# Patient Record
Sex: Female | Born: 1945 | Race: White | Hispanic: No | Marital: Married
Health system: Midwestern US, Community
[De-identification: ages and names within clinical notes are randomized; demographics above are authoritative.]

## PROBLEM LIST (undated history)

## (undated) DIAGNOSIS — J069 Acute upper respiratory infection, unspecified: Secondary | ICD-10-CM

## (undated) DIAGNOSIS — K5790 Diverticulosis of intestine, part unspecified, without perforation or abscess without bleeding: Secondary | ICD-10-CM

## (undated) DIAGNOSIS — E119 Type 2 diabetes mellitus without complications: Secondary | ICD-10-CM

## (undated) HISTORY — PX: HAND SURGERY: SHX662

---

## 1972-08-18 HISTORY — PX: ABDOMINAL HYSTERECTOMY: SHX81

## 2005-04-04 ENCOUNTER — Other Ambulatory Visit: Admission: RE | Admit: 2005-04-04 | Discharge: 2005-04-04 | Payer: Self-pay | Admitting: Family Medicine

## 2011-09-19 DIAGNOSIS — J069 Acute upper respiratory infection, unspecified: Secondary | ICD-10-CM

## 2011-09-19 HISTORY — DX: Acute upper respiratory infection, unspecified: J06.9

## 2011-12-08 ENCOUNTER — Encounter (HOSPITAL_COMMUNITY): Admission: RE | Admit: 2011-12-08 | Discharge: 2011-12-08 | Payer: Medicare Other | Source: Ambulatory Visit

## 2011-12-08 NOTE — Patient Instructions (Addendum)
Meghan Mcclure  12/08/2011   Your procedure is scheduled on:   12-18-2011  Report to [facility] at  0720  AM.  Call this number if you have problems the morning of surgery: [phone]   Remember:   Do not eat food:After Midnight.  May have clear liquids:until Midnight .  Clear liquids include soda, tea, black coffee, apple or grape juice, broth.  Take these medicines the morning of surgery with A SIP OF WATER:  none   Do not wear jewelry, make-up or nail polish.  Do not wear lotions, powders, or perfumes. You may wear deodorant.  Do not shave 48 hours prior to surgery.  Do not bring valuables to the hospital.  Contacts, dentures or bridgework may not be worn into surgery.  Leave suitcase in the car. After surgery it may be brought to your room.  For patients admitted to the hospital, checkout time is 11:00 AM the day of discharge.   Patients discharged the day of surgery will not be allowed to drive home.  Name and phone number of your driver: family  Special Instructions: N/A   Please read over the following fact sheets that you were given: Pain Booklet, Surgical Site Infection Prevention, Anesthesia Post-op Instructions and Care and Recovery After Surgery ataract A cataract is a clouding of the lens of the eye. When a lens becomes cloudy, vision is reduced based on the degree and nature of the clouding. Many cataracts reduce vision to some degree. Some cataracts make people more near-sighted as they develop. Other cataracts increase glare. Cataracts that are ignored and become worse can sometimes look white. The white color can be seen through the pupil. CAUSES   Aging. However, cataracts may occur at any age, even in newborns.   Certain drugs.   Trauma to the eye.   Certain diseases such as diabetes.   Specific eye diseases such as chronic inflammation inside the eye or a sudden attack of a rare form of glaucoma.   Inherited or acquired medical problems.  SYMPTOMS    Gradual, progressive drop in vision in the affected eye.   Severe, rapid visual loss. This most often happens when trauma is the cause.  DIAGNOSIS  To detect a cataract, an eye doctor examines the lens. Cataracts are best diagnosed with an exam of the eyes with the pupils enlarged (dilated) by drops.  TREATMENT  For an early cataract, vision may improve by using different eyeglasses or stronger lighting. If that does not help your vision, surgery is the only effective treatment. A cataract needs to be surgically removed when vision loss interferes with your everyday activities, such as driving, reading, or watching TV. A cataract may also have to be removed if it prevents examination or treatment of another eye problem. Surgery removes the cloudy lens and usually replaces it with a substitute lens (intraocular lens, IOL).  At a time when both you and your doctor agree, the cataract will be surgically removed. If you have cataracts in both eyes, only one is usually removed at a time. This allows the operated eye to heal and be out of danger from any possible problems after surgery (such as infection or poor wound healing). In rare cases, a cataract may be doing damage to your eye. In these cases, your caregiver may advise surgical removal right away. The vast majority of people who have cataract surgery have better vision afterward. HOME CARE INSTRUCTIONS  If you are not planning surgery, you may  be asked to do the following:  Use different eyeglasses.   Use stronger or brighter lighting.   Ask your eye doctor about reducing your medicine dose or changing medicines if it is thought that a medicine caused your cataract. Changing medicines does not make the cataract go away on its own.   Become familiar with your surroundings. Poor vision can lead to injury. Avoid bumping into things on the affected side. You are at a higher risk for tripping or falling.   Exercise extreme care when driving or  operating machinery.   Wear sunglasses if you are sensitive to bright light or experiencing problems with glare.  SEEK IMMEDIATE MEDICAL CARE IF:   You have a worsening or sudden vision loss.   You notice redness, swelling, or increasing pain in the eye.   You have a fever.  Document Released: 08/04/2005 Document Revised: 07/24/2011 Document Reviewed: 03/28/2011 [facility] Patient Information 2012 [place], [place].PATIENT INSTRUCTIONS POST-ANESTHESIA  IMMEDIATELY FOLLOWING SURGERY:  Do not drive or operate machinery for the first twenty four hours after surgery.  Do not make any important decisions for twenty four hours after surgery or while taking narcotic pain medications or sedatives.  If you develop intractable nausea and vomiting or a severe headache please notify your doctor immediately.  FOLLOW-UP:  Please make an appointment with your surgeon as instructed. You do not need to follow up with anesthesia unless specifically instructed to do so.  WOUND CARE INSTRUCTIONS (if applicable):  Keep a dry clean dressing on the anesthesia/puncture wound site if there is drainage.  Once the wound has quit draining you may leave it open to air.  Generally you should leave the bandage intact for twenty four hours unless there is drainage.  If the epidural site drains for more than 36-48 hours please call the anesthesia department.  QUESTIONS?:  Please feel free to call your physician or the hospital operator if you have any questions, and they will be happy to assist you.     [facility] Anesthesia Department [address] [place] [phone]

## 2011-12-10 ENCOUNTER — Encounter (HOSPITAL_COMMUNITY): Payer: Self-pay

## 2011-12-10 ENCOUNTER — Encounter (HOSPITAL_COMMUNITY)
Admission: RE | Admit: 2011-12-10 | Discharge: 2011-12-10 | Disposition: A | Discharge status: Home / Self Care | Payer: Medicare Other | Source: Ambulatory Visit | Attending: Ophthalmology | Admitting: Ophthalmology

## 2011-12-10 ENCOUNTER — Encounter (HOSPITAL_COMMUNITY): Payer: Self-pay | Admitting: Pharmacy Technician

## 2011-12-10 HISTORY — DX: Diverticulosis of intestine, part unspecified, without perforation or abscess without bleeding: K57.90

## 2011-12-10 HISTORY — DX: Acute upper respiratory infection, unspecified: J06.9

## 2011-12-10 HISTORY — DX: Type 2 diabetes mellitus without complications: E11.9

## 2011-12-10 LAB — BASIC METABOLIC PANEL
BUN: 20 mg/dL (ref 6–23)
CO2: 27 mEq/L (ref 19–32)
Calcium: 9.5 mg/dL (ref 8.4–10.5)
Chloride: 105 mEq/L (ref 96–112)
Creatinine, Ser: 0.8 mg/dL (ref 0.50–1.10)
Glucose, Bld: 153 mg/dL — ABNORMAL HIGH (ref 70–99)

## 2011-12-10 LAB — HEMOGLOBIN AND HEMATOCRIT, BLOOD: HCT: 42.7 % (ref 36.0–46.0)

## 2011-12-17 MED ORDER — LIDOCAINE HCL (PF) 1 % IJ SOLN
INTRAMUSCULAR | Status: AC
  Filled 2011-12-17: qty 2

## 2011-12-17 MED ORDER — NEOMYCIN-POLYMYXIN-DEXAMETH 3.5-10000-0.1 OP OINT
TOPICAL_OINTMENT | OPHTHALMIC | Status: AC
  Filled 2011-12-17: qty 3.5

## 2011-12-17 MED ORDER — CYCLOPENTOLATE-PHENYLEPHRINE 0.2-1 % OP SOLN
OPHTHALMIC | Status: AC
  Administered 2011-12-18: 1 [drp] via OPHTHALMIC
  Filled 2011-12-17: qty 2

## 2011-12-17 MED ORDER — LIDOCAINE HCL 3.5 % OP GEL
OPHTHALMIC | Status: AC
  Administered 2011-12-18: 1 via OPHTHALMIC
  Filled 2011-12-17: qty 5

## 2011-12-17 MED ORDER — PHENYLEPHRINE HCL 2.5 % OP SOLN
OPHTHALMIC | Status: AC
  Filled 2011-12-17: qty 2

## 2011-12-17 MED ORDER — TETRACAINE HCL 0.5 % OP SOLN
OPHTHALMIC | Status: AC
  Administered 2011-12-18: 1 [drp] via OPHTHALMIC
  Filled 2011-12-17: qty 2

## 2011-12-18 ENCOUNTER — Ambulatory Visit (HOSPITAL_COMMUNITY)
Admission: RE | Admit: 2011-12-18 | Discharge: 2011-12-18 | Disposition: A | Discharge status: Home / Self Care | Payer: Medicare Other | Source: Ambulatory Visit | Attending: Ophthalmology | Admitting: Ophthalmology

## 2011-12-18 ENCOUNTER — Encounter (HOSPITAL_COMMUNITY)
Admission: RE | Disposition: A | Discharge status: Home / Self Care | Payer: Self-pay | Source: Ambulatory Visit | Attending: Ophthalmology

## 2011-12-18 ENCOUNTER — Encounter (HOSPITAL_COMMUNITY): Payer: Self-pay | Admitting: Anesthesiology

## 2011-12-18 ENCOUNTER — Encounter (HOSPITAL_COMMUNITY): Payer: Self-pay | Admitting: *Deleted

## 2011-12-18 ENCOUNTER — Ambulatory Visit (HOSPITAL_COMMUNITY): Payer: Medicare Other | Admitting: Anesthesiology

## 2011-12-18 DIAGNOSIS — E119 Type 2 diabetes mellitus without complications: Secondary | ICD-10-CM | POA: Insufficient documentation

## 2011-12-18 DIAGNOSIS — Z01812 Encounter for preprocedural laboratory examination: Secondary | ICD-10-CM | POA: Insufficient documentation

## 2011-12-18 DIAGNOSIS — Z01810 Encounter for preprocedural cardiovascular examination: Secondary | ICD-10-CM | POA: Insufficient documentation

## 2011-12-18 DIAGNOSIS — H2589 Other age-related cataract: Secondary | ICD-10-CM | POA: Insufficient documentation

## 2011-12-18 HISTORY — PX: CATARACT EXTRACTION W/PHACO: SHX586

## 2011-12-18 SURGERY — PHACOEMULSIFICATION, CATARACT, WITH IOL INSERTION
Anesthesia: Monitor Anesthesia Care | Site: Eye | Laterality: Right | Wound class: Clean

## 2011-12-18 MED ORDER — TETRACAINE HCL 0.5 % OP SOLN
1.0000 [drp] | OPHTHALMIC | Status: AC
  Administered 2011-12-18 (×3): 1 [drp] via OPHTHALMIC

## 2011-12-18 MED ORDER — EPINEPHRINE HCL 1 MG/ML IJ SOLN
INTRAMUSCULAR | Status: AC
  Filled 2011-12-18: qty 1

## 2011-12-18 MED ORDER — EPINEPHRINE HCL 1 MG/ML IJ SOLN
INTRAOCULAR | Status: DC | PRN
  Administered 2011-12-18: 09:00:00

## 2011-12-18 MED ORDER — PROVISC 10 MG/ML IO SOLN
INTRAOCULAR | Status: DC | PRN
  Administered 2011-12-18: 8.5 mg via INTRAOCULAR

## 2011-12-18 MED ORDER — POVIDONE-IODINE 5 % OP SOLN
OPHTHALMIC | Status: DC | PRN
Start: 2011-12-18 — End: 2011-12-18
  Administered 2011-12-18: 1 via OPHTHALMIC

## 2011-12-18 MED ORDER — FENTANYL CITRATE 0.05 MG/ML IJ SOLN: 25.0000 ug | INTRAMUSCULAR | Status: DC | PRN

## 2011-12-18 MED ORDER — LACTATED RINGERS IV SOLN
INTRAVENOUS | Status: DC
  Administered 2011-12-18: 1000 mL via INTRAVENOUS

## 2011-12-18 MED ORDER — CYCLOPENTOLATE-PHENYLEPHRINE 0.2-1 % OP SOLN
1.0000 [drp] | OPHTHALMIC | Status: AC
  Administered 2011-12-18 (×3): 1 [drp] via OPHTHALMIC

## 2011-12-18 MED ORDER — MIDAZOLAM HCL 2 MG/2ML IJ SOLN
INTRAMUSCULAR | Status: AC
  Administered 2011-12-18: 2 mg via INTRAVENOUS
  Filled 2011-12-18: qty 2

## 2011-12-18 MED ORDER — NEOMYCIN-POLYMYXIN-DEXAMETH 0.1 % OP OINT
TOPICAL_OINTMENT | OPHTHALMIC | Status: DC | PRN
  Administered 2011-12-18: 1 via OPHTHALMIC

## 2011-12-18 MED ORDER — LIDOCAINE HCL (PF) 1 % IJ SOLN
INTRAMUSCULAR | Status: DC | PRN
  Administered 2011-12-18: .5 mL

## 2011-12-18 MED ORDER — LIDOCAINE 3.5 % OP GEL OPTIME - NO CHARGE
OPHTHALMIC | Status: DC | PRN
  Administered 2011-12-18: 1 [drp] via OPHTHALMIC

## 2011-12-18 MED ORDER — ONDANSETRON HCL 4 MG/2ML IJ SOLN: 4.0000 mg | Freq: Once | INTRAMUSCULAR | Status: DC | PRN

## 2011-12-18 MED ORDER — LIDOCAINE HCL 3.5 % OP GEL
1.0000 "application " | Freq: Once | OPHTHALMIC | Status: AC
  Administered 2011-12-18: 1 via OPHTHALMIC

## 2011-12-18 MED ORDER — MIDAZOLAM HCL 2 MG/2ML IJ SOLN
1.0000 mg | INTRAMUSCULAR | Status: DC | PRN
  Administered 2011-12-18: 2 mg via INTRAVENOUS

## 2011-12-18 MED ORDER — PHENYLEPHRINE HCL 2.5 % OP SOLN
1.0000 [drp] | OPHTHALMIC | Status: AC
  Administered 2011-12-18 (×3): 1 [drp] via OPHTHALMIC

## 2011-12-18 MED ORDER — BSS IO SOLN
INTRAOCULAR | Status: DC | PRN
  Administered 2011-12-18: 15 mL via INTRAOCULAR

## 2011-12-18 SURGICAL SUPPLY — 32 items
CAPSULAR TENSION RING-AMO (OPHTHALMIC RELATED) IMPLANT
CLOTH BEACON ORANGE TIMEOUT ST (SAFETY) ×2 IMPLANT
EYE SHIELD UNIVERSAL CLEAR (GAUZE/BANDAGES/DRESSINGS) ×4 IMPLANT
GLOVE BIO SURGEON STRL SZ 6.5 (GLOVE) IMPLANT
GLOVE BIOGEL PI IND STRL 6.5 (GLOVE) ×2 IMPLANT
GLOVE BIOGEL PI IND STRL 7.0 (GLOVE) IMPLANT
GLOVE BIOGEL PI IND STRL 7.5 (GLOVE) IMPLANT
GLOVE BIOGEL PI INDICATOR 6.5 (GLOVE) ×2
GLOVE BIOGEL PI INDICATOR 7.0 (GLOVE)
GLOVE BIOGEL PI INDICATOR 7.5 (GLOVE)
GLOVE ECLIPSE 6.5 STRL STRAW (GLOVE) IMPLANT
GLOVE ECLIPSE 7.0 STRL STRAW (GLOVE) IMPLANT
GLOVE ECLIPSE 7.5 STRL STRAW (GLOVE) IMPLANT
GLOVE EXAM NITRILE LRG STRL (GLOVE) IMPLANT
GLOVE EXAM NITRILE MD LF STRL (GLOVE) ×2 IMPLANT
GLOVE SKINSENSE NS SZ6.5 (GLOVE)
GLOVE SKINSENSE NS SZ7.0 (GLOVE)
GLOVE SKINSENSE STRL SZ6.5 (GLOVE) IMPLANT
GLOVE SKINSENSE STRL SZ7.0 (GLOVE) IMPLANT
KIT VITRECTOMY (OPHTHALMIC RELATED) IMPLANT
PAD ARMBOARD 7.5X6 YLW CONV (MISCELLANEOUS) ×2 IMPLANT
PROC W NO LENS (INTRAOCULAR LENS)
PROC W SPEC LENS (INTRAOCULAR LENS)
PROCESS W NO LENS (INTRAOCULAR LENS) IMPLANT
PROCESS W SPEC LENS (INTRAOCULAR LENS) IMPLANT
RING MALYGIN (MISCELLANEOUS) IMPLANT
SIGHTPATH CAT PROC W REG LENS (Ophthalmic Related) ×2 IMPLANT
SYR TB 1ML LL NO SAFETY (SYRINGE) ×2 IMPLANT
TAPE SURG TRANSPORE 1 IN (GAUZE/BANDAGES/DRESSINGS) ×1 IMPLANT
TAPE SURGICAL TRANSPORE 1 IN (GAUZE/BANDAGES/DRESSINGS) ×1
VISCOELASTIC ADDITIONAL (OPHTHALMIC RELATED) IMPLANT
WATER STERILE IRR 250ML POUR (IV SOLUTION) ×2 IMPLANT

## 2011-12-18 NOTE — Op Note (Signed)
NAME:  Meghan, Mcclure NO.:  [id number]  MEDICAL RECORD NO.:  [id number]  LOCATION:  [facility]                          FACILITY:  [facility]  PHYSICIAN:  Susanne Greenhouse, MD       DATE OF BIRTH:  1946-05-07  DATE OF PROCEDURE:  12/18/2011 DATE OF DISCHARGE:  12/18/2011                              OPERATIVE REPORT   PREOPERATIVE DIAGNOSIS:  Combined cataract, right eye, diagnosis code 366.19.  POSTOPERATIVE DIAGNOSIS:  Combined cataract, right eye, diagnosis code 366.19.  OPERATION PERFORMED:  Phacoemulsification with posterior chamber intraocular lens implantation, right eye.  SURGEON:  Bonne Dolores. Lajuana Patchell, MD  ANESTHESIA:  Topical with MAC.  OPERATIVE SUMMARY:  In the preoperative area, dilating drops were placed into the right eye.  The patient was then brought into the operating room where she was placed under general anesthesia.  The eye was then prepped and draped.  Beginning with a 75 blade, a paracentesis port was made at the surgeon's 2 o'clock position.  The anterior chamber was then filled with a 1% nonpreserved lidocaine solution with epinephrine.  This was followed by Viscoat to deepen the chamber.  A small fornix-based peritomy was performed superiorly.  Next, a single iris hook was placed through the limbus superiorly.  A 2.4-mm keratome blade was then used to make a clear corneal incision over the iris hook.  A bent cystotome needle and Utrata forceps were used to create a continuous tear capsulotomy.  Hydrodissection was performed using balanced salt solution on a fine cannula.  The lens nucleus was then removed using phacoemulsification in a quadrant cracking technique.  The cortical material was then removed with irrigation and aspiration.  The capsular bag and anterior chamber were refilled with Provisc.  The wound was widened to approximately 3 mm and a posterior chamber intraocular lens was placed into the capsular bag without difficulty using an  [employer] lens injecting system.  A single 10-0 nylon suture was then used to close the incision as well as stromal hydration.  The Provisc was removed from the anterior chamber and capsular bag with irrigation and aspiration.  At this point, the wounds were tested for leak, which were negative.  The anterior chamber remained deep and stable.  The patient tolerated the procedure well.  There were no operative complications, and she awoke from general anesthesia without problem.  No surgical specimens.  Prosthetic device used is a Lenstec posterior chamber lens, model Softec HD, power of 15.5, serial number is [id number].          ______________________________ Susanne Greenhouse, MD     KEH/MEDQ  D:  12/18/2011  T:  12/18/2011  Job:  [id number]

## 2011-12-18 NOTE — H&P (Signed)
I have reviewed the H&P, the patient was re-examined, and I have identified no interval changes in medical condition and plan of care since the history and physical of record  

## 2011-12-18 NOTE — Anesthesia Procedure Notes (Signed)
Procedure Name: MAC Date/Time: 12/18/2011 8:43 AM Performed by: Franco Nones Pre-anesthesia Checklist: Patient identified, Emergency Drugs available, Suction available, Timeout performed and Patient being monitored Patient Re-evaluated:Patient Re-evaluated prior to inductionOxygen Delivery Method: Nasal Cannula

## 2011-12-18 NOTE — Anesthesia Postprocedure Evaluation (Signed)
  Anesthesia Post-op Note  Patient: Meghan Mcclure  Procedure(s) Performed: Procedure(s) (LRB): CATARACT EXTRACTION PHACO AND INTRAOCULAR LENS PLACEMENT (IOC) (Right)  Patient Location:  Short Stay  Anesthesia Type: MAC  Level of Consciousness: awake  Airway and Oxygen Therapy: Patient Spontanous Breathing  Post-op Pain: none  Post-op Assessment: Post-op Vital signs reviewed, Patient's Cardiovascular Status Stable, Respiratory Function Stable, Patent Airway, No signs of Nausea or vomiting and Pain level controlled  Post-op Vital Signs: Reviewed and stable  Complications: No apparent anesthesia complications

## 2011-12-18 NOTE — Anesthesia Preprocedure Evaluation (Addendum)
Anesthesia Evaluation  Patient identified by MRN, date of birth, ID band Patient awake    Reviewed: Allergy & Precautions, H&P , NPO status , Patient's Chart, lab work & pertinent test results  Airway Mallampati: II      Dental  (+) Teeth Intact   Pulmonary Recent URI , Resolved,  breath sounds clear to auscultation        Cardiovascular negative cardio ROS  Rhythm:Regular     Neuro/Psych    GI/Hepatic   Endo/Other  Diabetes mellitus-, Well Controlled, Type 2, Oral Hypoglycemic Agents  Renal/GU      Musculoskeletal   Abdominal   Peds  Hematology   Anesthesia Other Findings   Reproductive/Obstetrics                           Anesthesia Physical Anesthesia Plan  ASA: II  Anesthesia Plan: MAC   Post-op Pain Management:    Induction: Intravenous  Airway Management Planned: Nasal Cannula  Additional Equipment:   Intra-op Plan:   Post-operative Plan:   Informed Consent: I have reviewed the patients History and Physical, chart, labs and discussed the procedure including the risks, benefits and alternatives for the proposed anesthesia with the patient or authorized representative who has indicated his/her understanding and acceptance.     Plan Discussed with:   Anesthesia Plan Comments:         Anesthesia Quick Evaluation

## 2011-12-18 NOTE — Transfer of Care (Signed)
Immediate Anesthesia Transfer of Care Note  Patient: Meghan Mcclure  Procedure(s) Performed: Procedure(s) (LRB): CATARACT EXTRACTION PHACO AND INTRAOCULAR LENS PLACEMENT (IOC) (Right)  Patient Location: Shortstay  Anesthesia Type: MAC  Level of Consciousness: awake  Airway & Oxygen Therapy: Patient Spontanous Breathing   Post-op Assessment: Report given to PACU RN, Post -op Vital signs reviewed and stable and Patient moving all extremities  Post vital signs: Reviewed and stable  Complications: No apparent anesthesia complications

## 2011-12-18 NOTE — Brief Op Note (Signed)
Pre-Op Dx: Cataract OD Post-Op Dx: Cataract OD Surgeon: Kearra Calkin Anesthesia: Topical with MAC Surgery: Cataract Extraction with Intraocular lens Implant OD Implant: Lenstec, Model Softec HD Blood Loss: None Specimen: None Complications: None 

## 2011-12-18 NOTE — Discharge Instructions (Signed)
Meghan Mcclure  12/18/2011     Instructions  1. Use medications as Instructed.  Shake well before use. Wait 5 minutes between drops.  {OPHTHALMIC ANTIBIOTICS:22167} 4 times a day x 1 week.  {OPHTHALMIC ANTI-INFLAMMATORY:22168} 2 times a day x 4 weeks.  {OPHTHALMIC STEROID:22169} 4 times a day - week 1   3 times a day - Week 2, 2 times a day- Week 3, 1 time a day - Week 4.  2. Do not rub the operative eye. Do not swim underwater for 2 weeks.  3. You may remove the clear shield and resume your normal activities the day after  Surgery. Your eyes may feel more comfortable if you wear dark glasses outside.  4. Call our office at [phone] if you have sudden change in vision, extreme redness or pain. Some fluctuation in vision is normal after surgery. If you have an emergency after hours, call Dr. Alto Denver at [phone].  5. It is important that you attend all of your follow-up appointments.        Follow-up:{follow up:32580} with Gemma Payor, MD.   Dr. Lahoma Crocker: [phone]  Dr. Lita Mains: [phone]  Dr. Alto Denver: [phone]   If you find that you cannot contact your physician, but feel that your signs and   Symptoms warrant a physician's attention, call the Emergency Room at   [phone] [phone].   Other{NA AND WUXLKGMW:10272}.

## 2011-12-22 ENCOUNTER — Encounter (HOSPITAL_COMMUNITY): Payer: Self-pay | Admitting: Ophthalmology

## 2011-12-25 ENCOUNTER — Encounter (HOSPITAL_COMMUNITY)
Admission: RE | Admit: 2011-12-25 | Discharge: 2011-12-25 | Payer: Medicare Other | Source: Ambulatory Visit | Admitting: Ophthalmology

## 2011-12-25 ENCOUNTER — Encounter (HOSPITAL_COMMUNITY): Payer: Self-pay | Admitting: *Deleted

## 2011-12-26 MED ORDER — CYCLOPENTOLATE-PHENYLEPHRINE 0.2-1 % OP SOLN
OPHTHALMIC | Status: AC
  Filled 2011-12-26: qty 2

## 2011-12-26 MED ORDER — PHENYLEPHRINE HCL 2.5 % OP SOLN
OPHTHALMIC | Status: AC
  Filled 2011-12-26: qty 2

## 2011-12-26 MED ORDER — NEOMYCIN-POLYMYXIN-DEXAMETH 3.5-10000-0.1 OP OINT
TOPICAL_OINTMENT | OPHTHALMIC | Status: AC
  Filled 2011-12-26: qty 3.5

## 2011-12-26 MED ORDER — LIDOCAINE HCL 3.5 % OP GEL
OPHTHALMIC | Status: AC
  Filled 2011-12-26: qty 5

## 2011-12-26 MED ORDER — TETRACAINE HCL 0.5 % OP SOLN
OPHTHALMIC | Status: AC
  Filled 2011-12-26: qty 2

## 2011-12-26 MED ORDER — LIDOCAINE HCL (PF) 1 % IJ SOLN
INTRAMUSCULAR | Status: AC
  Filled 2011-12-26: qty 2

## 2011-12-29 ENCOUNTER — Ambulatory Visit (HOSPITAL_COMMUNITY): Payer: Medicare Other | Admitting: Anesthesiology

## 2011-12-29 ENCOUNTER — Encounter (HOSPITAL_COMMUNITY): Payer: Self-pay | Admitting: Anesthesiology

## 2011-12-29 ENCOUNTER — Ambulatory Visit (HOSPITAL_COMMUNITY)
Admission: RE | Admit: 2011-12-29 | Discharge: 2011-12-29 | Disposition: A | Discharge status: Home / Self Care | Payer: Medicare Other | Source: Ambulatory Visit | Attending: Ophthalmology | Admitting: Ophthalmology

## 2011-12-29 ENCOUNTER — Encounter (HOSPITAL_COMMUNITY): Payer: Self-pay | Admitting: *Deleted

## 2011-12-29 ENCOUNTER — Encounter (HOSPITAL_COMMUNITY)
Admission: RE | Disposition: A | Discharge status: Home / Self Care | Payer: Self-pay | Source: Ambulatory Visit | Attending: Ophthalmology

## 2011-12-29 DIAGNOSIS — E119 Type 2 diabetes mellitus without complications: Secondary | ICD-10-CM | POA: Insufficient documentation

## 2011-12-29 DIAGNOSIS — H2589 Other age-related cataract: Secondary | ICD-10-CM | POA: Insufficient documentation

## 2011-12-29 HISTORY — PX: CATARACT EXTRACTION W/PHACO: SHX586

## 2011-12-29 SURGERY — PHACOEMULSIFICATION, CATARACT, WITH IOL INSERTION
Anesthesia: Monitor Anesthesia Care | Site: Eye | Laterality: Left | Wound class: Clean

## 2011-12-29 MED ORDER — PHENYLEPHRINE HCL 2.5 % OP SOLN
OPHTHALMIC | Status: AC
  Filled 2011-12-29: qty 2

## 2011-12-29 MED ORDER — PROVISC 10 MG/ML IO SOLN
INTRAOCULAR | Status: DC | PRN
  Administered 2011-12-29: 8.5 mg via INTRAOCULAR

## 2011-12-29 MED ORDER — LIDOCAINE HCL 3.5 % OP GEL: 1.0000 "application " | Freq: Once | OPHTHALMIC | Status: DC

## 2011-12-29 MED ORDER — EPINEPHRINE HCL 1 MG/ML IJ SOLN
INTRAMUSCULAR | Status: AC
  Filled 2011-12-29: qty 1

## 2011-12-29 MED ORDER — CYCLOPENTOLATE-PHENYLEPHRINE 0.2-1 % OP SOLN
1.0000 [drp] | OPHTHALMIC | Status: AC
  Administered 2011-12-29 (×3): 1 [drp] via OPHTHALMIC

## 2011-12-29 MED ORDER — LIDOCAINE 3.5 % OP GEL OPTIME - NO CHARGE
OPHTHALMIC | Status: DC | PRN
  Administered 2011-12-29: 2 [drp] via OPHTHALMIC

## 2011-12-29 MED ORDER — BSS IO SOLN
INTRAOCULAR | Status: DC | PRN
  Administered 2011-12-29: 15 mL via INTRAOCULAR

## 2011-12-29 MED ORDER — MIDAZOLAM HCL 2 MG/2ML IJ SOLN
1.0000 mg | INTRAMUSCULAR | Status: DC | PRN
Start: 2011-12-29 — End: 2011-12-29
  Administered 2011-12-29: 2 mg via INTRAVENOUS

## 2011-12-29 MED ORDER — NEOMYCIN-POLYMYXIN-DEXAMETH 0.1 % OP OINT
TOPICAL_OINTMENT | OPHTHALMIC | Status: DC | PRN
  Administered 2011-12-29: 1 via OPHTHALMIC

## 2011-12-29 MED ORDER — ONDANSETRON HCL 4 MG/2ML IJ SOLN: 4.0000 mg | Freq: Once | INTRAMUSCULAR | Status: DC | PRN

## 2011-12-29 MED ORDER — TETRACAINE HCL 0.5 % OP SOLN
OPHTHALMIC | Status: AC
  Filled 2011-12-29: qty 2

## 2011-12-29 MED ORDER — FENTANYL CITRATE 0.05 MG/ML IJ SOLN
25.0000 ug | INTRAMUSCULAR | Status: DC | PRN
Start: 2011-12-29 — End: 2011-12-29

## 2011-12-29 MED ORDER — FLURBIPROFEN SODIUM 0.03 % OP SOLN
OPHTHALMIC | Status: AC
  Filled 2011-12-29: qty 2.5

## 2011-12-29 MED ORDER — PHENYLEPHRINE HCL 2.5 % OP SOLN
1.0000 [drp] | OPHTHALMIC | Status: AC
  Administered 2011-12-29 (×3): 1 [drp] via OPHTHALMIC

## 2011-12-29 MED ORDER — CYCLOPENTOLATE-PHENYLEPHRINE 0.2-1 % OP SOLN
OPHTHALMIC | Status: AC
  Filled 2011-12-29: qty 2

## 2011-12-29 MED ORDER — POVIDONE-IODINE 5 % OP SOLN
OPHTHALMIC | Status: DC | PRN
  Administered 2011-12-29: 1 via OPHTHALMIC

## 2011-12-29 MED ORDER — MIDAZOLAM HCL 2 MG/2ML IJ SOLN
INTRAMUSCULAR | Status: AC
  Administered 2011-12-29: 2 mg via INTRAVENOUS
  Filled 2011-12-29: qty 2

## 2011-12-29 MED ORDER — LIDOCAINE HCL (PF) 1 % IJ SOLN
INTRAMUSCULAR | Status: DC | PRN
  Administered 2011-12-29: .4 mL

## 2011-12-29 MED ORDER — EPINEPHRINE HCL 1 MG/ML IJ SOLN
INTRAOCULAR | Status: DC | PRN
  Administered 2011-12-29: 11:00:00

## 2011-12-29 MED ORDER — LACTATED RINGERS IV SOLN
INTRAVENOUS | Status: DC
  Administered 2011-12-29: 11:00:00 via INTRAVENOUS

## 2011-12-29 MED ORDER — TETRACAINE HCL 0.5 % OP SOLN
1.0000 [drp] | OPHTHALMIC | Status: AC
  Administered 2011-12-29 (×3): 1 [drp] via OPHTHALMIC

## 2011-12-29 SURGICAL SUPPLY — 32 items
CAPSULAR TENSION RING-AMO (OPHTHALMIC RELATED) IMPLANT
CLOTH BEACON ORANGE TIMEOUT ST (SAFETY) ×2 IMPLANT
EYE SHIELD UNIVERSAL CLEAR (GAUZE/BANDAGES/DRESSINGS) ×2 IMPLANT
GLOVE BIO SURGEON STRL SZ 6.5 (GLOVE) IMPLANT
GLOVE BIOGEL PI IND STRL 6.5 (GLOVE) IMPLANT
GLOVE BIOGEL PI IND STRL 7.0 (GLOVE) ×1 IMPLANT
GLOVE BIOGEL PI IND STRL 7.5 (GLOVE) IMPLANT
GLOVE BIOGEL PI INDICATOR 6.5 (GLOVE)
GLOVE BIOGEL PI INDICATOR 7.0 (GLOVE) ×1
GLOVE BIOGEL PI INDICATOR 7.5 (GLOVE)
GLOVE ECLIPSE 6.5 STRL STRAW (GLOVE) IMPLANT
GLOVE ECLIPSE 7.0 STRL STRAW (GLOVE) IMPLANT
GLOVE ECLIPSE 7.5 STRL STRAW (GLOVE) IMPLANT
GLOVE EXAM NITRILE LRG STRL (GLOVE) IMPLANT
GLOVE EXAM NITRILE MD LF STRL (GLOVE) ×2 IMPLANT
GLOVE SKINSENSE NS SZ6.5 (GLOVE)
GLOVE SKINSENSE NS SZ7.0 (GLOVE)
GLOVE SKINSENSE STRL SZ6.5 (GLOVE) IMPLANT
GLOVE SKINSENSE STRL SZ7.0 (GLOVE) IMPLANT
KIT VITRECTOMY (OPHTHALMIC RELATED) IMPLANT
PAD ARMBOARD 7.5X6 YLW CONV (MISCELLANEOUS) ×2 IMPLANT
PROC W NO LENS (INTRAOCULAR LENS)
PROC W SPEC LENS (INTRAOCULAR LENS)
PROCESS W NO LENS (INTRAOCULAR LENS) IMPLANT
PROCESS W SPEC LENS (INTRAOCULAR LENS) IMPLANT
RING MALYGIN (MISCELLANEOUS) IMPLANT
SIGHTPATH CAT PROC W REG LENS (Ophthalmic Related) ×2 IMPLANT
SYR TB 1ML LL NO SAFETY (SYRINGE) ×2 IMPLANT
TAPE SURG TRANSPORE 1 IN (GAUZE/BANDAGES/DRESSINGS) ×1 IMPLANT
TAPE SURGICAL TRANSPORE 1 IN (GAUZE/BANDAGES/DRESSINGS) ×1
VISCOELASTIC ADDITIONAL (OPHTHALMIC RELATED) IMPLANT
WATER STERILE IRR 250ML POUR (IV SOLUTION) ×2 IMPLANT

## 2011-12-29 NOTE — Discharge Instructions (Signed)
Meghan Mcclure  12/29/2011     Instructions  1. Use medications as Instructed.  Shake well before use. Wait 5 minutes between drops.  {OPHTHALMIC ANTIBIOTICS:22167} 4 times a day x 1 week.  {OPHTHALMIC ANTI-INFLAMMATORY:22168} 2 times a day x 4 weeks.  {OPHTHALMIC STEROID:22169} 4 times a day - week 1   3 times a day - Week 2, 2 times a day- Week 3, 1 time a day - Week 4.  2. Do not rub the operative eye. Do not swim underwater for 2 weeks.  3. You may remove the clear shield and resume your normal activities the day after  Surgery. Your eyes may feel more comfortable if you wear dark glasses outside.  4. Call our office at [phone] if you have sudden change in vision, extreme redness or pain. Some fluctuation in vision is normal after surgery. If you have an emergency after hours, call Dr. Alto Denver at [phone].  5. It is important that you attend all of your follow-up appointments.        Follow-up:{follow up:32580} with Meghan Payor, MD.   Dr. Lahoma Crocker: [phone]  Dr. Lita Mains: [phone]  Dr. Alto Denver: [phone]   If you find that you cannot contact your physician, but feel that your signs and   Symptoms warrant a physician's attention, call the Emergency Room at   [phone] ext.[phone].   Other{NA AND PPIRJJOA:41660}.

## 2011-12-29 NOTE — Brief Op Note (Signed)
Pre-Op Dx: Cataract OS Post-Op Dx: Cataract OS Surgeon: Deshawn Witty Anesthesia: Topical with MAC Surgery: Cataract Extraction with Intraocular lens Implant OS Implant: Lenstec, Model Softec HD Specimen: None Complications: None 

## 2011-12-29 NOTE — H&P (Signed)
I have reviewed the H&P, the patient was re-examined, and I have identified no interval changes in medical condition and plan of care since the history and physical of record  

## 2011-12-29 NOTE — Anesthesia Preprocedure Evaluation (Signed)
Anesthesia Evaluation  Patient identified by MRN, date of birth, ID band Patient awake    Reviewed: Allergy & Precautions, H&P , NPO status , Patient's Chart, lab work & pertinent test results  Airway Mallampati: II      Dental  (+) Teeth Intact   Pulmonary Recent URI , Resolved,  breath sounds clear to auscultation        Cardiovascular negative cardio ROS  Rhythm:Regular     Neuro/Psych    GI/Hepatic   Endo/Other  Diabetes mellitus-, Well Controlled, Type 2, Oral Hypoglycemic Agents  Renal/GU      Musculoskeletal   Abdominal   Peds  Hematology   Anesthesia Other Findings   Reproductive/Obstetrics                           Anesthesia Physical Anesthesia Plan  ASA: III  Anesthesia Plan: MAC   Post-op Pain Management:    Induction: Intravenous  Airway Management Planned: Nasal Cannula  Additional Equipment:   Intra-op Plan:   Post-operative Plan:   Informed Consent: I have reviewed the patients History and Physical, chart, labs and discussed the procedure including the risks, benefits and alternatives for the proposed anesthesia with the patient or authorized representative who has indicated his/her understanding and acceptance.     Plan Discussed with:   Anesthesia Plan Comments:         Anesthesia Quick Evaluation

## 2011-12-29 NOTE — Anesthesia Postprocedure Evaluation (Signed)
  Anesthesia Post-op Note  Patient: Meghan Mcclure  Procedure(s) Performed: Procedure(s) (LRB): CATARACT EXTRACTION PHACO AND INTRAOCULAR LENS PLACEMENT (IOC) (Left)  Patient Location: PACU and Short Stay  Anesthesia Type: MAC  Level of Consciousness: awake, alert , oriented and patient cooperative  Airway and Oxygen Therapy: Patient Spontanous Breathing  Post-op Pain: none  Post-op Assessment: Post-op Vital signs reviewed, Patient's Cardiovascular Status Stable, Respiratory Function Stable, Patent Airway, No signs of Nausea or vomiting and Pain level controlled  Post-op Vital Signs: Reviewed and stable  Complications: No apparent anesthesia complications

## 2011-12-29 NOTE — Transfer of Care (Signed)
Immediate Anesthesia Transfer of Care Note  Patient: Meghan Mcclure  Procedure(s) Performed: Procedure(s) (LRB): CATARACT EXTRACTION PHACO AND INTRAOCULAR LENS PLACEMENT (IOC) (Left)  Patient Location: PACU and Short Stay  Anesthesia Type: MAC  Level of Consciousness: awake, alert , oriented and patient cooperative  Airway & Oxygen Therapy: Patient Spontanous Breathing  Post-op Assessment: Report given to PACU RN, Post -op Vital signs reviewed and stable and Patient moving all extremities  Post vital signs: Reviewed and stable  Complications: No apparent anesthesia complications

## 2011-12-29 NOTE — Op Note (Signed)
NAME:  Meghan Mcclure, Meghan Mcclure                ACCOUNT NO.:  [id number]  MEDICAL RECORD NO.:  [id number]  LOCATION:  [facility]                          FACILITY:  [facility]  PHYSICIAN:  Susanne Greenhouse, MD       DATE OF BIRTH:  May 04, 1946  DATE OF PROCEDURE:  12/29/2011 DATE OF DISCHARGE:  12/29/2011                              OPERATIVE REPORT   PREOPERATIVE DIAGNOSIS:  Combined cataract, left eye, diagnosis code 366.19.  POSTOPERATIVE DIAGNOSIS:  Combined cataract, left eye, diagnosis code 366.19.  SURGEON:  Bonne Dolores. Fraser Busche, MD  ANESTHESIA:  Topical with monitored anesthesia care.  DESCRIPTION OF THE OPERATION:  In the preoperative holding area, dilating drop and viscous lidocaine were placed into the left eye.  The patient was then brought to the operating room where she was prepped and draped.  Beginning with a #75 blade, a paracentesis port was made at the surgeon's 2 o'clock position.  The anterior chamber was filled with a 1% nonpreserved lidocaine solution.  Because of poor visualization of the red reflex, the anterior chamber was filled with VisionBlue and the VisionBlue was then rinsed from the anterior chamber with balanced salt solution.  The anterior chamber was then filled with Provisc.  A 2.4-mm keratome blade was then used to make a clear corneal incision at the temporal limbus.  A bent cystotome needle was used to create a continuous tear capsulotomy.  Hydrodissection was performed with balanced salt solution and a fine cannula.  The lens nucleus was then removed using phacoemulsification and a quadrant cracking technique. Residual cortex was removed with irrigation and aspiration.  A capsular bag and anterior chamber were refilled with Provisc and a posterior chamber intraocular lens was placed into the capsular bag without difficulty using its lens injecting system.  The Provisc was then removed from the capsular bag and anterior chamber with irrigation and aspiration.  Stromal  hydration of the main incision and paracentesis ports was performed with balanced salt solution and a fine cannula.  The wounds were tested for leak, which were negative.  The patient tolerated the procedure well.  There were no operative complications and she was returned to the recovery area in satisfactory condition.  No surgical specimens.  Prosthetic device used is a Lenstec posterior chamber lens, model Softec HD, power of 14.0, serial number is [id number].          ______________________________ Susanne Greenhouse, MD    KEH/MEDQ  D:  12/29/2011  T:  12/29/2011  Job:  [id number]

## 2011-12-31 ENCOUNTER — Encounter (HOSPITAL_COMMUNITY): Payer: Self-pay | Admitting: Ophthalmology

## 2014-09-27 DIAGNOSIS — R079 Chest pain, unspecified: Secondary | ICD-10-CM

## 2014-09-27 NOTE — ED Notes (Addendum)
Pt arrives to the ED via EMS with chest pain that started a half hour PTA. Pt states she took 81mg  aspirin and her chest pain was relieved. Pt states the "chest pain came on all of a sudden and it felt hard to breath." Pt denies history of cardiac disease. Pt awaiting physician assessment. Labs drawn and sent to laboratory. Pt on monitor x3. EKG performed and given to Dr Ermalinda MemosBradshaw

## 2014-09-27 NOTE — ED Notes (Signed)
Dr Sharlot GowdaGregg at bedside to evaluate patient

## 2014-09-27 NOTE — ED Notes (Signed)
Phlebotomy at bedside for lab draw

## 2014-09-27 NOTE — ED Provider Notes (Signed)
HPI Comments: Rachael Randall is a 69 y.o. female with significant PMHx of DM who presents via EMS to [facility] ED c/o sudden onset, gradually improving mid-sternal CP [phone] this PM. Pt reports her pain began in her chest that radiated to her R shoulder and then jaw. She states her pain has been occuring intermittently for a "couple years". Pt notes the pain is usually relieved by sitting up and trying to relax but did not provide relief for this episode. She reports only mild pain in her R shoulder at time of exam and her jaw and CP has resolved spontaneously. Pt has been using $RemoveBe'81mg'TeHkssbzW$  ASA, none today, and natural remedies which have both provide relief, but she has not been using the remedies recently. She states she saw her PCP 2 months ago and have routine blood work done. She reports FHx of heart disease in mother. Pt denies exacerbating factors and hx of high cholesterol or HTN.     PCP: Phys Other, MD    PMHx: Significant for DM  PSHx: Significant for unspecified GYN surgery  Social Hx: -smoker, +EtOH (socially)    There are no other changes, complaints, or physical findings at this time.  Written by Lawanna Kobus, ED Scribe, as dictated by Loni Beckwith, MD.    The history is provided by the patient.        Past Medical History:   Diagnosis Date   ??? Diabetes [facility])        Past Surgical History:   Procedure Laterality Date   ??? Hx gyn       partial   ??? Hx orthopaedic       right hand surgery         History reviewed. No pertinent family history.    History     Social History   ??? Marital Status: MARRIED     Spouse Name: N/A   ??? Number of Children: N/A   ??? Years of Education: N/A     Occupational History   ??? Not on file.     Social History Main Topics   ??? Smoking status: Never Smoker    ??? Smokeless tobacco: Not on file   ??? Alcohol Use: Yes      Comment: occasionally   ??? Drug Use: Not on file   ??? Sexual Activity: Not on file     Other Topics Concern   ??? Not on file     Social History Narrative   ??? No narrative on file            ALLERGIES: Ciprofloxacin      Review of Systems   Constitutional: Negative for fever, activity change, appetite change and fatigue.   HENT: Negative.  Negative for congestion, rhinorrhea and sore throat.    Respiratory: Negative.  Negative for cough, shortness of breath and wheezing.    Cardiovascular: Positive for chest pain. Negative for leg swelling.   Gastrointestinal: Negative.  Negative for nausea, vomiting, abdominal pain, diarrhea, constipation and abdominal distention.   Endocrine: Negative.    Genitourinary: Negative for dysuria, vaginal bleeding, vaginal discharge, difficulty urinating and menstrual problem.   Musculoskeletal: Negative.  Negative for myalgias, joint swelling and arthralgias.   Skin: Negative.  Negative for rash.   Neurological: Negative.  Negative for dizziness, weakness, light-headedness and headaches.   Psychiatric/Behavioral: Negative.        Patient Vitals for the past 12 hrs:   Temp Pulse Resp BP SpO2   09/27/14 2315 - Marland Kitchen)  59 11 185/75 mmHg 95 %   09/27/14 2300 - 62 12 187/81 mmHg 96 %   09/27/14 2245 - (!) 54 13 176/76 mmHg 97 %   09/27/14 2230 - (!) 57 10 171/70 mmHg 97 %   09/27/14 2215 - (!) 57 10 174/67 mmHg 95 %   09/27/14 2207 - (!) 56 10 163/85 mmHg 97 %   09/27/14 2145 - (!) 59 15 - 96 %   09/27/14 2130 - (!) 57 10 - 96 %   09/27/14 2115 - 65 14 - 96 %   09/27/14 2100 - 60 11 - 97 %   09/27/14 2045 98.7 ??F (37.1 ??C) 67 11 (!) 193/160 mmHg 95 %   09/27/14 2043 - 65 11 (!) 193/160 mmHg 94 %        Physical Exam   Constitutional: She is oriented to person, place, and time. She appears well-developed and well-nourished.   HENT:   Head: Atraumatic.   Eyes: EOM are normal.   Cardiovascular: Normal rate, regular rhythm, normal heart sounds and intact distal pulses.  Exam reveals no gallop and no friction rub.    No murmur heard.  Pulmonary/Chest: Effort normal and breath sounds normal. No respiratory distress. She has no wheezes. She has no rales. She exhibits no  tenderness.   Abdominal: Soft. Bowel sounds are normal. She exhibits no distension and no mass. There is no tenderness. There is no rebound and no guarding.   Musculoskeletal: Normal range of motion. She exhibits no edema or tenderness.   Neurological: She is oriented to person, place, and time.   Skin: Skin is warm.   Psychiatric: She has a normal mood and affect.   Nursing note and vitals reviewed.  Written by Lawanna Kobus, ED Scribe, as dictated by Loni Beckwith, MD.      MDM  Number of Diagnoses or Management Options  Atypical chest pain:   Chest pain radiating to jaw:   Diagnosis management comments: DDx: ACS, acute MI, unstable angina, atypical CP, low suspicion for PNA  Will obtain EKG, basic labs, and perform a two set cardiac enzyme cardiac work-up.  Reassess pt's pain and treat as needed. Pt has declined need for pain medication during H &P.       Amount and/or Complexity of Data Reviewed  Clinical lab tests: ordered and reviewed  Tests in the radiology section of CPT??: ordered and reviewed  Tests in the medicine section of CPT??: ordered and reviewed  Review and summarize past medical records: yes  Independent visualization of images, tracings, or specimens: yes    Patient Progress  Patient progress: stable      Procedures      EKG interpretation: (Preliminary)  2043  Rhythm: normal sinus rhythm; and regular . Rate (approx.): 63; Axis: normal; P wave: normal; QRS interval: normal ; ST/T wave: normal; Other findings: Left anterior fascicular block.   Written by Lawanna Kobus, ED Scribe, as dictated by Loni Beckwith, MD.    Progress Note:  10:28 PM  Pt declines pain medications at this time.  Written by Lawanna Kobus, ED Scribe, as dictated by Loni Beckwith, MD.     Progress Note:  12:13 AM  The patient has been re-evaluated. Both enzymes returned negative. She denies any jaw or CP. She had no recurrence of sxs during ED visit. Pt was given strict return precautions because she will be returning to [facility] Friday  and unable to have PCP F/U until Monday.  Written by  Lawanna Kobus, ED Scribe, as dictated by Loni Beckwith, MD.       LABORATORY TESTS:  Recent Results (from the past 12 hour(s))   EKG, 12 LEAD, INITIAL    Collection Time: 09/27/14  8:43 PM   Result Value Ref Range    Ventricular Rate 63 BPM    Atrial Rate 63 BPM    P-R Interval 150 ms    QRS Duration 86 ms    Q-T Interval 380 ms    QTC Calculation (Bezet) 388 ms    Calculated P Axis 10 degrees    Calculated R Axis -45 degrees    Calculated T Axis 59 degrees    Diagnosis       Normal sinus rhythm  Left anterior fascicular block  No previous ECGs available     METABOLIC PANEL, COMPREHENSIVE    Collection Time: 09/27/14  8:52 PM   Result Value Ref Range    Sodium 138 136 - 145 mmol/L    Potassium 4.2 3.5 - 5.1 mmol/L    Chloride 106 97 - 108 mmol/L    CO2 23 21 - 32 mmol/L    Anion gap 9 5 - 15 mmol/L    Glucose 243 (H) 65 - 100 mg/dL    BUN 21 (H) 6 - 20 MG/DL    Creatinine 1.06 (H) 0.55 - 1.02 MG/DL    BUN/Creatinine ratio 20 12 - 20      GFR est AA >60 >60 ml/min/1.34m    GFR est non-AA 52 (L) >60 ml/min/1.770m   Calcium 8.2 (L) 8.5 - 10.1 MG/DL    Bilirubin, total 0.3 0.2 - 1.0 MG/DL    ALT 32 12 - 78 U/L    AST 25 15 - 37 U/L    Alk. phosphatase 56 45 - 117 U/L    Protein, total 7.1 6.4 - 8.2 g/dL    Albumin 3.1 (L) 3.5 - 5.0 g/dL    Globulin 4.0 2.0 - 4.0 g/dL    A-G Ratio 0.8 (L) 1.1 - 2.2     CBC WITH AUTOMATED DIFF    Collection Time: 09/27/14  8:52 PM   Result Value Ref Range    WBC 9.6 3.6 - 11.0 K/uL    RBC 4.43 3.80 - 5.20 M/uL    HGB 14.0 11.5 - 16.0 g/dL    HCT 40.3 35.0 - 47.0 %    MCV 91.0 80.0 - 99.0 FL    MCH 31.6 26.0 - 34.0 PG    MCHC 34.7 30.0 - 36.5 g/dL    RDW 12.8 11.5 - 14.5 %    PLATELET 215 150 - 400 K/uL    NEUTROPHILS 59 32 - 75 %    LYMPHOCYTES 33 12 - 49 %    MONOCYTES 7 5 - 13 %    EOSINOPHILS 1 0 - 7 %    BASOPHILS 0 0 - 1 %    ABS. NEUTROPHILS 5.7 1.8 - 8.0 K/UL    ABS. LYMPHOCYTES 3.2 0.8 - 3.5 K/UL     ABS. MONOCYTES 0.7 0.0 - 1.0 K/UL    ABS. EOSINOPHILS 0.1 0.0 - 0.4 K/UL    ABS. BASOPHILS 0.0 0.0 - 0.1 K/UL   TROPONIN I    Collection Time: 09/27/14  8:52 PM   Result Value Ref Range    Troponin-I, Qt. <0.04 <0.05 ng/mL   CK W/ REFLX CKMB    Collection Time: 09/27/14  8:52 PM   Result Value Ref Range  CK 93 26 - 192 U/L   TROPONIN I    Collection Time: 09/27/14 11:03 PM   Result Value Ref Range    Troponin-I, Qt. <0.04 <0.05 ng/mL   CK W/ REFLX CKMB    Collection Time: 09/27/14 11:03 PM   Result Value Ref Range    CK 77 26 - 192 U/L         Imaging Results    ??    ?? XR CHEST PA LAT (Final result) Result time: 09/27/14 21:37:09   ?? Final result by Rad Results In Edi (09/27/14 21:37:09)   ?? Narrative:   ?? **Final Report**  ??    ICD Codes / Adm.Diagnosis: 100000 ?? / Chest Pain (Angina) ??  Examination: ??CR CHEST PA AND LATERAL ??- [id number] - Sep 27 2014 ??9:32PM  Accession No: ??[id number]  Reason: ??cp      REPORT:  Exam: ??2 view chest    Indication: Chest pain today    PA and lateral views demonstrate normal heart size. There is no acute   process in the lung fields. The osseous structures are unremarkable.  ????    IMPRESSION: No acute process.  ????    ??  ??  Signing/Reading Doctor: Zachery Conch [phone]) ??  Approved: Zachery Conch [phone]) ??Sep 27 2014 ??9:35PM ?? ?? ?? ?? ?? ?? ?? ?? ?? ??          IMPRESSION:  1. Atypical chest pain    2. Chest pain radiating to jaw        PLAN:  1. F/U with Dr Urbano Heir (cardiology) in 1 -2 days if you stay in [facility]  2. Or have your PCP schedule cardiology follow-up early next week if you stay here through Friday.  Return to ED if worse or symptoms recur    Discharge Note:  12:19 AM  The patient is ready for discharge. The patient???s signs, symptoms, diagnosis, and discharge instructions have been discussed and the patient has conveyed their understanding. The patient is to follow up as recommended or return to the ER should their symptoms worsen. Plan has  been discussed and the patient is in agreement.  Written by Lawanna Kobus, ED Scribe, as dictated by Loni Beckwith, MD.

## 2014-09-28 ENCOUNTER — Inpatient Hospital Stay
Admit: 2014-09-28 | Discharge: 2014-09-28 | Disposition: A | Discharge status: Home / Self Care | Payer: MEDICARE | Attending: Emergency Medicine

## 2014-09-28 LAB — CBC WITH AUTOMATED DIFF
ABS. BASOPHILS: 0 10*3/uL (ref 0.0–0.1)
ABS. EOSINOPHILS: 0.1 10*3/uL (ref 0.0–0.4)
ABS. LYMPHOCYTES: 3.2 10*3/uL (ref 0.8–3.5)
ABS. MONOCYTES: 0.7 10*3/uL (ref 0.0–1.0)
ABS. NEUTROPHILS: 5.7 10*3/uL (ref 1.8–8.0)
BASOPHILS: 0 % (ref 0–1)
EOSINOPHILS: 1 % (ref 0–7)
HCT: 40.3 % (ref 35.0–47.0)
HGB: 14 g/dL (ref 11.5–16.0)
LYMPHOCYTES: 33 % (ref 12–49)
MCH: 31.6 PG (ref 26.0–34.0)
MCHC: 34.7 g/dL (ref 30.0–36.5)
MCV: 91 FL (ref 80.0–99.0)
MONOCYTES: 7 % (ref 5–13)
NEUTROPHILS: 59 % (ref 32–75)
PLATELET: 215 10*3/uL (ref 150–400)
RBC: 4.43 M/uL (ref 3.80–5.20)
RDW: 12.8 % (ref 11.5–14.5)
WBC: 9.6 10*3/uL (ref 3.6–11.0)

## 2014-09-28 LAB — METABOLIC PANEL, COMPREHENSIVE
A-G Ratio: 0.8 — ABNORMAL LOW (ref 1.1–2.2)
ALT (SGPT): 32 U/L (ref 12–78)
AST (SGOT): 25 U/L (ref 15–37)
Albumin: 3.1 g/dL — ABNORMAL LOW (ref 3.5–5.0)
Alk. phosphatase: 56 U/L (ref 45–117)
Anion gap: 9 mmol/L (ref 5–15)
BUN/Creatinine ratio: 20 (ref 12–20)
BUN: 21 MG/DL — ABNORMAL HIGH (ref 6–20)
Bilirubin, total: 0.3 MG/DL (ref 0.2–1.0)
CO2: 23 mmol/L (ref 21–32)
Calcium: 8.2 MG/DL — ABNORMAL LOW (ref 8.5–10.1)
Chloride: 106 mmol/L (ref 97–108)
Creatinine: 1.06 MG/DL — ABNORMAL HIGH (ref 0.55–1.02)
GFR est AA: >60 mL/min/{1.73_m2} (ref >60)
GFR est non-AA: 52 mL/min/{1.73_m2} — ABNORMAL LOW (ref >60)
Globulin: 4 g/dL (ref 2.0–4.0)
Glucose: 243 mg/dL — ABNORMAL HIGH (ref 65–100)
Potassium: 4.2 mmol/L (ref 3.5–5.1)
Protein, total: 7.1 g/dL (ref 6.4–8.2)
Sodium: 138 mmol/L (ref 136–145)

## 2014-09-28 LAB — CK W/ REFLX CKMB
CK: 93 U/L (ref 26–192)
CK: 77 U/L (ref 26–192)

## 2014-09-28 LAB — TROPONIN I
Troponin-I, Qt.: <0.04 ng/mL (ref <0.05)
Troponin-I, Qt.: <0.04 ng/mL (ref <0.05)

## 2014-09-28 LAB — EKG, 12 LEAD, INITIAL
Atrial Rate: 63 {beats}/min
Calculated P Axis: 10 degrees
Calculated R Axis: -45 degrees
Calculated T Axis: 59 degrees
Diagnosis: NORMAL
P-R Interval: 150 ms
Q-T Interval: 380 ms
QRS Duration: 86 ms
QTC Calculation (Bezet): 388 ms
Ventricular Rate: 63 {beats}/min

## 2014-09-28 NOTE — ED Notes (Signed)
The doctor has reviewed discharge instructions with the patient. The patient verbalized understanding of the plan of care. Pt ambulatory out of ED with family member by side

## 2014-10-25 ENCOUNTER — Encounter: Payer: Self-pay | Admitting: Cardiology

## 2014-10-25 ENCOUNTER — Ambulatory Visit (INDEPENDENT_AMBULATORY_CARE_PROVIDER_SITE_OTHER): Payer: Medicare HMO | Admitting: Cardiology

## 2014-10-25 VITALS — BP 150/74 | HR 56 | Ht 62.0 in | Wt 255.2 lb

## 2014-10-25 DIAGNOSIS — R072 Precordial pain: Secondary | ICD-10-CM

## 2014-10-25 DIAGNOSIS — R0789 Other chest pain: Secondary | ICD-10-CM

## 2014-10-25 DIAGNOSIS — R079 Chest pain, unspecified: Secondary | ICD-10-CM | POA: Insufficient documentation

## 2014-10-25 MED ORDER — NITROGLYCERIN 0.4 MG SL SUBL: 0.4000 mg | SUBLINGUAL_TABLET | SUBLINGUAL | Status: DC | PRN

## 2014-10-25 NOTE — Patient Instructions (Signed)
Your physician recommends that you schedule a follow-up appointment in: 3 weeks with Joni ReiningKathryn Lawrence NP   START Nitroglycerine as needed for chest pain   Your physician has requested that you have en exercise stress myoview. For further information please visit [email or web address]. Please follow instruction sheet, as given.      Thank you for choosing  [facility] !

## 2014-10-25 NOTE — Progress Notes (Signed)
Clinical Summary [name] is a 69 y.o.female seen today as a new patient for the following medical problems.  1. Chest pain - admit 09/[facility] to [facility] [place]. From notes thought to be atypical, she was ruled out for ACS and discharged.   -episode occurred at rest. Pressure like pain midchest to right arm and jaw, 10/10. Mild palpitations. Not positional. Lasted x 20 minutes. Symptoms on and off x 2 years. This was most severe episode. Can often increase with stress. On average 2 times week. Can be better with leaning forward and taking deep breaths. No relation to food.   - notes some DOE, especially with walking inclines. Note some occasional LE edema, no orthopnea.    CAD risk factors: DM2 diet controlled,  mother with history of blockages unknown age.   2. Sinus brady - denies any lightheadness or dizziness.  - reviewing old EKGs appears to be chronic  Past Medical History  Diagnosis Date  . Type 2 diabetes, diet controlled   . Diverticulosis   . Recurrent upper respiratory infection (URI) 09/2011     Allergies  Allergen Reactions  . Ciprofloxacin Nausea And Vomiting and Hypertension     Current Outpatient Prescriptions  Medication Sig Dispense Refill  . aspirin 81 MG chewable tablet Chew 81 mg by mouth every morning.    . naproxen sodium (ANAPROX) 220 MG tablet Take 220 mg by mouth 2 (two) times daily with a meal.     No current facility-administered medications for this visit.     Past Surgical History  Procedure Laterality Date  . Abdominal hysterectomy  1974  . Hand surgery      left  . Cataract extraction w/phaco  12/18/2011    Procedure: CATARACT EXTRACTION PHACO AND INTRAOCULAR LENS PLACEMENT (IOC);  Surgeon: Gemma PayorKerry Hunt, MD;  Location: [facility];  Service: Ophthalmology;  Laterality: Right;  CDE:15.97  . Cataract extraction w/phaco  12/29/2011    Procedure: CATARACT EXTRACTION PHACO AND INTRAOCULAR LENS PLACEMENT  (IOC);  Surgeon: Gemma PayorKerry Hunt, MD;  Location: [facility];  Service: Ophthalmology;  Laterality: Left;  CDE 15.36     Allergies  Allergen Reactions  . Ciprofloxacin Nausea And Vomiting and Hypertension      Family History  Problem Relation Age of Onset  . Anesthesia problems Neg Hx   . Hypotension Neg Hx   . Malignant hyperthermia Neg Hx   . Pseudochol deficiency Neg Hx      Social History [name] reports that she has never smoked. She does not have any smokeless tobacco history on file. [name] reports that she drinks about 0.6 oz of alcohol per week.   Review of Systems CONSTITUTIONAL: No weight loss, fever, chills, weakness or fatigue.  HEENT: Eyes: No visual loss, blurred vision, double vision or yellow sclerae.No hearing loss, sneezing, congestion, runny nose or sore throat.  SKIN: No rash or itching.  CARDIOVASCULAR: per HPI RESPIRATORY: No shortness of breath, cough or sputum.  GASTROINTESTINAL: No anorexia, nausea, vomiting or diarrhea. No abdominal pain or blood.  GENITOURINARY: No burning on urination, no polyuria NEUROLOGICAL: No headache, dizziness, syncope, paralysis, ataxia, numbness or tingling in the extremities. No change in bowel or bladder control.  MUSCULOSKELETAL: No muscle, back pain, joint pain or stiffness.  LYMPHATICS: No enlarged nodes. No history of splenectomy.  PSYCHIATRIC: No history of depression or anxiety.  ENDOCRINOLOGIC: No reports of sweating, cold or heat intolerance. No polyuria or polydipsia.  .Marland Kitchen  Physical Examination p 56 bp 150/74 Wt 255 lbs BMI 47 Gen: resting comfortably, no acute distress HEENT: no scleral icterus, pupils equal round and reactive, no palptable cervical adenopathy,  CV: RRR, no m/r/g, no JVD, no carotid bruits Resp: Clear to auscultation bilaterally GI: abdomen is soft, non-tender, non-distended, normal bowel sounds, no hepatosplenomegaly MSK: extremities are warm, no edema.  Skin: warm, no rash Neuro:  no  focal deficits Psych: appropriate affect   Diagnostic Studies  Clinic EKG: sinus bradycardia, non-specific ST/T changes   Assessment and Plan  1. Chest pain - unclear etiology, patient is diabetic putting her at high risk for cardiac disease - will plan for exercise cardiolite to further evaluate for ischemia given her symptoms and risk factors.  - give Rx for prn NG  F/u 3 weeks.      Antoine Poche, M.D.

## 2014-10-27 ENCOUNTER — Ambulatory Visit (HOSPITAL_COMMUNITY)
Admission: RE | Admit: 2014-10-27 | Discharge: 2014-10-27 | Disposition: A | Discharge status: Home / Self Care | Payer: Medicare HMO | Source: Ambulatory Visit | Attending: Cardiology | Admitting: Cardiology

## 2014-10-27 ENCOUNTER — Encounter (HOSPITAL_COMMUNITY): Payer: Self-pay

## 2014-10-27 ENCOUNTER — Encounter (HOSPITAL_COMMUNITY)
Admission: RE | Admit: 2014-10-27 | Discharge: 2014-10-27 | Disposition: A | Discharge status: Home / Self Care | Payer: Medicare HMO | Source: Ambulatory Visit | Attending: Cardiology | Admitting: Cardiology

## 2014-10-27 DIAGNOSIS — E119 Type 2 diabetes mellitus without complications: Secondary | ICD-10-CM | POA: Insufficient documentation

## 2014-10-27 DIAGNOSIS — R079 Chest pain, unspecified: Secondary | ICD-10-CM | POA: Diagnosis not present

## 2014-10-27 DIAGNOSIS — R072 Precordial pain: Secondary | ICD-10-CM

## 2014-10-27 MED ORDER — SODIUM CHLORIDE 0.9 % IJ SOLN
10.0000 mL | INTRAMUSCULAR | Status: DC | PRN
  Administered 2014-10-27: 10 mL via INTRAVENOUS
  Filled 2014-10-27: qty 10

## 2014-10-27 MED ORDER — TECHNETIUM TC 99M SESTAMIBI - CARDIOLITE
30.0000 | Freq: Once | INTRAVENOUS | Status: AC | PRN
  Administered 2014-10-27: 10:00:00 30 via INTRAVENOUS

## 2014-10-27 MED ORDER — REGADENOSON 0.4 MG/5ML IV SOLN
INTRAVENOUS | Status: AC
  Administered 2014-10-27: 0.4 mg via INTRAVENOUS
  Filled 2014-10-27: qty 5

## 2014-10-27 MED ORDER — SODIUM CHLORIDE 0.9 % IJ SOLN
INTRAMUSCULAR | Status: AC
  Administered 2014-10-27: 10 mL via INTRAVENOUS
  Filled 2014-10-27: qty 3

## 2014-10-27 MED ORDER — REGADENOSON 0.4 MG/5ML IV SOLN
0.4000 mg | Freq: Once | INTRAVENOUS | Status: AC | PRN
  Administered 2014-10-27: 0.4 mg via INTRAVENOUS

## 2014-10-27 MED ORDER — TECHNETIUM TC 99M SESTAMIBI GENERIC - CARDIOLITE
10.0000 | Freq: Once | INTRAVENOUS | Status: AC | PRN
  Administered 2014-10-27: 10 via INTRAVENOUS

## 2014-10-27 NOTE — Progress Notes (Signed)
Stress Lab Nurses Notes - [facility]  Gareth EagleSusan G Colberg 10/27/2014 Reason for doing test: Chest Pain Type of test: Test Changed unable to reach THR on treadmill, Lexiscan given. Nurse performing test: Parke PoissonPhyllis Billingsly, RN Nuclear Medicine Tech: Marcella DubsMiranda Womack Echo Tech: Not Applicable MD performing test: S. McDowell/K.Lyman BishopLawrence NP Family MD: [facility] Test explained and consent signed: Yes.   IV started: Saline lock flushed, No redness or edema and Saline lock started in radiology Symptoms: fatigue & nausea Treatment/Intervention: None Reason test stopped: Fatigue in arms & legs.  Had nausea with Lexiscan. After recovery IV was: Discontinued via X-ray tech and No redness or edema Patient to return to Nuc. Med at : 10:30 Patient discharged: Home Patient's Condition upon discharge was: stable Comments: During test peak BP 154/68 & HR 129.  Recovery BP 115/61 & HR 75.  Symptoms resolved in recovery.  Erskine SpeedBillingsley, Ceasia Elwell T

## 2014-10-31 ENCOUNTER — Encounter: Payer: Self-pay | Admitting: Cardiology

## 2014-11-16 ENCOUNTER — Encounter: Payer: Medicare HMO | Admitting: Adult Health

## 2014-11-16 NOTE — Progress Notes (Signed)
ERROR. Cancelled appt 

## 2015-11-05 ENCOUNTER — Other Ambulatory Visit: Payer: Self-pay | Admitting: Cardiology

## 2016-03-24 ENCOUNTER — Emergency Department (HOSPITAL_COMMUNITY)
Admission: EM | Admit: 2016-03-24 | Discharge: 2016-03-24 | Disposition: A | Discharge status: Home / Self Care | Payer: No Typology Code available for payment source | Attending: Emergency Medicine | Admitting: Emergency Medicine

## 2016-03-24 ENCOUNTER — Emergency Department (HOSPITAL_COMMUNITY): Payer: No Typology Code available for payment source

## 2016-03-24 ENCOUNTER — Encounter (HOSPITAL_COMMUNITY): Payer: Self-pay | Admitting: Emergency Medicine

## 2016-03-24 DIAGNOSIS — E119 Type 2 diabetes mellitus without complications: Secondary | ICD-10-CM | POA: Diagnosis not present

## 2016-03-24 DIAGNOSIS — Y92410 Unspecified street and highway as the place of occurrence of the external cause: Secondary | ICD-10-CM | POA: Diagnosis not present

## 2016-03-24 DIAGNOSIS — H6092 Unspecified otitis externa, left ear: Secondary | ICD-10-CM | POA: Diagnosis not present

## 2016-03-24 DIAGNOSIS — M549 Dorsalgia, unspecified: Secondary | ICD-10-CM | POA: Insufficient documentation

## 2016-03-24 DIAGNOSIS — S199XXA Unspecified injury of neck, initial encounter: Secondary | ICD-10-CM | POA: Diagnosis present

## 2016-03-24 DIAGNOSIS — S161XXA Strain of muscle, fascia and tendon at neck level, initial encounter: Secondary | ICD-10-CM | POA: Insufficient documentation

## 2016-03-24 DIAGNOSIS — Y999 Unspecified external cause status: Secondary | ICD-10-CM | POA: Insufficient documentation

## 2016-03-24 DIAGNOSIS — Y939 Activity, unspecified: Secondary | ICD-10-CM | POA: Insufficient documentation

## 2016-03-24 MED ORDER — HYDROCODONE-ACETAMINOPHEN 5-325 MG PO TABS
1.0000 | ORAL_TABLET | Freq: Once | ORAL | Status: AC
  Administered 2016-03-24: 1 via ORAL
  Filled 2016-03-24: qty 1

## 2016-03-24 MED ORDER — AZITHROMYCIN 250 MG PO TABS: 250.0000 mg | ORAL_TABLET | Freq: Every day | ORAL | 0 refills | Status: AC

## 2016-03-24 MED ORDER — HYDROCODONE-ACETAMINOPHEN 5-325 MG PO TABS: 1.0000 | ORAL_TABLET | Freq: Four times a day (QID) | ORAL | 0 refills | Status: AC | PRN

## 2016-03-24 MED ORDER — NAPROXEN 500 MG PO TABS: 500.0000 mg | ORAL_TABLET | Freq: Two times a day (BID) | ORAL | 0 refills | Status: AC

## 2016-03-24 NOTE — Discharge Instructions (Signed)
Continue take the Cipro eardrops. Start taking the Zithromax. Take Naprosyn on a regular basis for the next week. Take the hydrocodone as needed for breakthrough pain. Keep your appointment to follow-up with her primary care doctor as scheduled for tomorrow.
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
## 2016-04-16 ENCOUNTER — Ambulatory Visit: Payer: Self-pay | Admitting: Pediatrics

## 2016-07-04 IMAGING — NM NM MYOCAR MULTI W/SPECT W/WALL MOTION & EF
2 series · 12 of 12 positions shown · non-contrast
Comparison: none

CLINICAL DATA: 68-year-old woman with a history of diabetes
mellitus and chest pain. This study is requested to evaluate for the
presence of ischemia.

EXAM:
MYOCARDIAL IMAGING WITH SPECT (REST AND PHARMACOLOGIC-STRESS)
GATED LEFT VENTRICULAR WALL MOTION STUDY
LEFT VENTRICULAR EJECTION FRACTION
TECHNIQUE: Standard myocardial SPECT imaging was performed after resting
intravenous injection of 10 mCi Rc-PPm sestamibi. Subsequently,
intravenous infusion of Lexiscan was performed under the supervision
of the Cardiology staff. At peak effect of the drug, 30 mCi Rc-PPm
sestamibi was injected intravenously and standard myocardial SPECT
imaging was performed. Quantitative gated imaging was also performed
to evaluate left ventricular wall motion, and estimate left
ventricular ejection fraction.

[Series 1: rest · 8.28mm/px · 6 of 64 frames shown]
[frame 6/64]
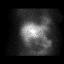
[frame 16/64]
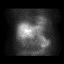
[frame 27/64]
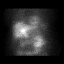
[frame 38/64]
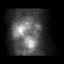
[frame 48/64]
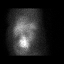
[frame 59/64]
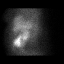

[Series 2: stress gated · 8.28mm/px · 6 of 64 frames shown]
[frame 6/64]
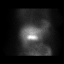
[frame 16/64]
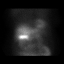
[frame 27/64]
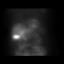
[frame 38/64]
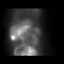
[frame 48/64]
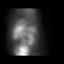
[frame 59/64]
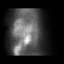

[12 of 12 positions shown; findings below may reference images not displayed]

FINDINGS: Baseline tracing shows sinus bradycardia at 45 beats per min with
low voltage and decreased R wave progression. She was exercised on a
Bruce protocol for 4 min and 36 seconds achieving a maximum workload
of 7 METS. Heart rate increased from 44 beats per min up to 129
beats per min which was 84% of the maximal age predicted heart rate.
Lexiscan was also given to ensure adequate stress response. Blood
pressure increased from 131/67 up to 154/68. No chest pain was
reported. She did experience fatigue and shortness of breath with
exercise. There were no diagnostic ST segment abnormalities, and no
arrhythmias were noted. Low risk Duke treadmill score 4.5.

Analysis of the raw perfusion data finds breast attenuation.

Perfusion: There is a small, mild to moderate intensity, apical
anterior/anteroseptal defect that exhibits partial reversibility.
Summed stress score is 4. This represents either variable soft
tissue attenuation versus small region of ischemia.

Wall Motion: Normal left ventricular wall motion. No left
ventricular dilation.

Left Ventricular Ejection Fraction: 71 %

End diastolic volume 80 wall on ml

End systolic volume 24 ml
IMPRESSION: 1. Apical anterior/anteroseptal defect representing either variable
breast attenuation or a small region of ischemia. No diagnostic ST
segment abnormalities on GXT at 84% MPHR (Lexiscan also given), low
risk Duke treadmill score 4.5.

2. Normal left ventricular wall motion.

3. Left ventricular ejection fraction 71%

4. Low-risk stress test findings*.

*1811 Appropriate Use Criteria for Coronary Revascularization
Focused Update: J Am Coll Cardiol. 1811;59(9):857-881.
[URL]

## 2021-07-28 ENCOUNTER — Other Ambulatory Visit: Payer: Self-pay

## 2021-07-28 ENCOUNTER — Encounter (HOSPITAL_COMMUNITY): Payer: Self-pay | Admitting: Emergency Medicine

## 2021-07-28 ENCOUNTER — Emergency Department (HOSPITAL_COMMUNITY)
Admission: EM | Admit: 2021-07-28 | Discharge: 2021-07-28 | Disposition: A | Discharge status: Home / Self Care | Payer: Medicare Other | Attending: Emergency Medicine | Admitting: Emergency Medicine

## 2021-07-28 ENCOUNTER — Emergency Department (HOSPITAL_COMMUNITY): Payer: Medicare Other

## 2021-07-28 DIAGNOSIS — Z7982 Long term (current) use of aspirin: Secondary | ICD-10-CM | POA: Diagnosis not present

## 2021-07-28 DIAGNOSIS — Z79899 Other long term (current) drug therapy: Secondary | ICD-10-CM | POA: Diagnosis not present

## 2021-07-28 DIAGNOSIS — E119 Type 2 diabetes mellitus without complications: Secondary | ICD-10-CM | POA: Insufficient documentation

## 2021-07-28 DIAGNOSIS — W11XXXA Fall on and from ladder, initial encounter: Secondary | ICD-10-CM | POA: Insufficient documentation

## 2021-07-28 DIAGNOSIS — S0990XA Unspecified injury of head, initial encounter: Secondary | ICD-10-CM | POA: Diagnosis present

## 2021-07-28 DIAGNOSIS — Z23 Encounter for immunization: Secondary | ICD-10-CM | POA: Diagnosis not present

## 2021-07-28 DIAGNOSIS — M545 Low back pain, unspecified: Secondary | ICD-10-CM | POA: Insufficient documentation

## 2021-07-28 DIAGNOSIS — M25512 Pain in left shoulder: Secondary | ICD-10-CM | POA: Insufficient documentation

## 2021-07-28 DIAGNOSIS — S0101XA Laceration without foreign body of scalp, initial encounter: Secondary | ICD-10-CM | POA: Diagnosis not present

## 2021-07-28 DIAGNOSIS — W19XXXA Unspecified fall, initial encounter: Secondary | ICD-10-CM | POA: Diagnosis not present

## 2021-07-28 DIAGNOSIS — Y92000 Kitchen of unspecified non-institutional (private) residence as  the place of occurrence of the external cause: Secondary | ICD-10-CM | POA: Insufficient documentation

## 2021-07-28 MED ORDER — LIDOCAINE-EPINEPHRINE (PF) 2 %-1:200000 IJ SOLN
10.0000 mL | Freq: Once | INTRAMUSCULAR | Status: AC
  Administered 2021-07-28: 10 mL
  Filled 2021-07-28: qty 20

## 2021-07-28 MED ORDER — TETANUS-DIPHTH-ACELL PERTUSSIS 5-2.5-18.5 LF-MCG/0.5 IM SUSY
0.5000 mL | PREFILLED_SYRINGE | Freq: Once | INTRAMUSCULAR | Status: AC
  Administered 2021-07-28: 0.5 mL via INTRAMUSCULAR
  Filled 2021-07-28: qty 0.5

## 2021-07-28 NOTE — Discharge Instructions (Addendum)
You were seen in the emergency department for evaluation of a head injury after a fall.  You had a CAT scan that did not show any bleeding internally in your head.  Your wound was cleaned and stapled and these will need to be removed in 7 to 10 days.  You can apply ice to the area and use Tylenol and ibuprofen as needed for pain.  You may shower.  Return to the emergency department if any signs of infection or other concerns.

## 2021-07-28 NOTE — ED Notes (Signed)
Wound irrigated, stapler at bedside.

## 2021-07-28 NOTE — ED Notes (Signed)
Pt A&OX4 ambulatory at d/c with independent steady gait. Offered wheelchair but she declined. Pt verbalized understanding of d/c instructions, wound care and follow up care.
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
## 2021-07-28 NOTE — ED Notes (Signed)
Pt transported to CT ?

## 2021-07-28 NOTE — ED Provider Notes (Signed)
[facility] EMERGENCY DEPARTMENT Provider Note   CSN: [id number] Arrival date & time: 07/28/21  1625     History Chief Complaint  Patient presents with   Meghan Mcclure    Meghan Mcclure is a 75 y.o. female.  Has a history of diabetes.  Complaining of a scalp laceration after she fell in the kitchen while she was on a 3 step ladder.  She said the ladder slipped out and she hit her head on the dishwasher when she fell.  No loss of consciousness.  Complaining of laceration to the back of her head and a mild headache.  She took 2 Advil prior to arrival.  She is a little bit of soreness in her low back and her left shoulder which are chronic areas but a little bit worse since the fall.  She does not feel like anything is broken.  No numbness or weakness chest pain or shortness of breath.  She has been ambulatory without any difficulty.  The history is provided by the patient.  Head Injury Location:  L parietal Mechanism of injury: fall   Fall:    Fall occurred:  From a ladder   Height of fall:  2   Impact surface:  Meghan Mcclure of impact:  Head   Entrapped after fall: no   Pain details:    Quality:  Dull   Severity:  Mild   Timing:  Constant   Progression:  Improving Chronicity:  New Relieved by:  NSAIDs Worsened by:  Nothing Ineffective treatments:  None tried Associated symptoms: no blurred vision, no difficulty breathing, no double vision, no focal weakness, no loss of consciousness, no nausea, no neck pain, no numbness and no vomiting       Past Medical History:  Diagnosis Date   Diverticulosis    Recurrent upper respiratory infection (URI) 09/2011   Type 2 diabetes, diet controlled [facility])     Patient Active Problem List   Diagnosis Date Noted   Chest pain 10/25/2014    Past Surgical History:  Procedure Laterality Date   ABDOMINAL HYSTERECTOMY  1974   CATARACT EXTRACTION W/PHACO  12/18/2011   Procedure: CATARACT EXTRACTION PHACO AND INTRAOCULAR LENS PLACEMENT (IOC);   Surgeon: Gemma Payor, MD;  Location: [facility];  Service: Ophthalmology;  Laterality: Right;  CDE:15.97   CATARACT EXTRACTION W/PHACO  12/29/2011   Procedure: CATARACT EXTRACTION PHACO AND INTRAOCULAR LENS PLACEMENT (IOC);  Surgeon: Gemma Payor, MD;  Location: [facility];  Service: Ophthalmology;  Laterality: Left;  CDE 15.36   HAND SURGERY     left     OB History   No obstetric history on file.     Family History  Problem Relation Age of Onset   Diabetes Mother    Pancreatic cancer Mother    Anesthesia problems Neg Hx    Hypotension Neg Hx    Malignant hyperthermia Neg Hx    Pseudochol deficiency Neg Hx     Social History   Tobacco Use   Smoking status: Never   Smokeless tobacco: Never  Substance Use Topics   Alcohol use: Yes    Alcohol/week: 1.0 standard drink    Types: 1 Glasses of wine per week   Drug use: No    Home Medications Prior to Admission medications   Medication Sig Start Date End Date Taking? Authorizing Provider  aspirin 81 MG chewable tablet Chew 81 mg by mouth every morning.    [provider]  azithromycin (ZITHROMAX) 250 MG tablet  Take 1 tablet (250 mg total) by mouth daily. Take first 2 tablets together, then 1 every day until finished. 03/24/16   Vanetta Mulders, MD  Cholecalciferol (VITAMIN D3) 5000 UNITS TABS Take 1 tablet by mouth daily.    [provider]  Cinnamon 500 MG TABS Take 1 tablet by mouth daily.    [provider]  Skipper Cliche 565 MG CAPS Take 1 Drop/kg by mouth as needed.    [provider]  HYDROcodone-acetaminophen (NORCO/VICODIN) 5-325 MG tablet Take 1-2 tablets by mouth every 6 (six) hours as needed. 03/24/16   Vanetta Mulders, MD  LIFESCAN Roma Kayser II LANCETS MISC Check blood sugar three times daily 03/12/12   [provider]  magnesium oxide (MAG-OX) 400 MG tablet Take 400 mg by mouth daily.    [provider]  naproxen (NAPROSYN) 500 MG tablet Take 1 tablet (500 mg total) by mouth 2  (two) times daily. 03/24/16   Vanetta Mulders, MD  nitroGLYCERIN (NITROSTAT) 0.4 MG SL tablet Place 1 tablet (0.4 mg total) under the tongue every 5 (five) minutesas needed for chest pain. 11/05/15   Antoine Poche, MD  OVER THE COUNTER MEDICATION Take 1 tablet by mouth 2 (two) times daily. [place], patient states that she takes 1 tablet by mouth daily    [provider]  OVER THE COUNTER MEDICATION Take 1 tablet by mouth daily. 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    [provider]  vitamin C (ASCORBIC ACID) 500 MG tablet Take 500 mg by mouth daily.    [provider]    Allergies    Ciprofloxacin  Review of Systems   Review of Systems  Constitutional:  Negative for fever.  HENT:  Negative for sore throat.   Eyes:  Negative for blurred vision, double vision and visual disturbance.  Respiratory:  Negative for shortness of breath.   Cardiovascular:  Negative for chest pain.  Gastrointestinal:  Negative for abdominal pain, nausea and vomiting.  Genitourinary:  Negative for dysuria.  Musculoskeletal:  Negative for neck pain.  Skin:  Positive for wound. Negative for rash.  Neurological:  Negative for focal weakness, loss of consciousness and numbness.   Physical Exam Updated Vital Signs BP (!) 140/59 (BP Location: Right Arm)   Pulse (!) 58   Temp 97.9 F (36.6 C) (Oral)   Resp 18   Ht  (1.549 m)   Wt 91.2 kg   SpO2 100%   BMI 37.98 kg/m   Physical Exam Vitals and nursing note reviewed.  Constitutional:      General: She is not in acute distress.    Appearance: Normal appearance. She is well-developed.  HENT:     Head: Normocephalic.     Comments: Proximal 3 cm laceration left parietal with no active bleeding.  No evidence of skull depression. Eyes:     Extraocular Movements: Extraocular movements intact.     Conjunctiva/sclera: Conjunctivae normal.     Pupils: Pupils are equal,  round, and reactive to light.  Cardiovascular:     Rate and Rhythm: Normal rate and regular rhythm.     Heart sounds: No murmur heard. Pulmonary:     Effort: Pulmonary effort is normal. No respiratory distress.     Breath sounds: Normal breath sounds.  Abdominal:     Palpations: Abdomen is soft.     Tenderness: There is no abdominal tenderness.  Musculoskeletal:  General: No swelling or deformity. Normal range of motion.     Cervical back: Neck supple. No tenderness.     Comments: She has some mild tenderness with range of motion of her left shoulder although she says this is chronic for her.  She has mild paralumbar tenderness but no midline tenderness.  No thoracic midline tenderness.  Skin:    General: Skin is warm and dry.     Capillary Refill: Capillary refill takes less than 2 seconds.  Neurological:     General: No focal deficit present.     Mental Status: She is alert and oriented to person, place, and time.     Sensory: No sensory deficit.     Motor: No weakness.  Psychiatric:        Mood and Affect: Mood normal.    ED Results / Procedures / Treatments   Labs (all labs ordered are listed, but only abnormal results are displayed) Labs Reviewed - No data to display  EKG None  Radiology CT Head Wo Contrast  Result Date: 07/28/2021 CLINICAL DATA:  Head trauma, patient fell 3 step ladder and hit head EXAM: CT HEAD WITHOUT CONTRAST TECHNIQUE: Contiguous axial images were obtained from the base of the skull through the vertex without intravenous contrast. COMPARISON:  None. FINDINGS: Brain: No evidence of acute infarction, hemorrhage, hydrocephalus, extra-axial collection or mass lesion/mass effect. Vascular: No hyperdense vessel or unexpected calcification. Skull: Normal. Negative for fracture or focal lesion. Sinuses/Orbits: No acute finding. Other: None. IMPRESSION: No acute intracranial abnormality. Electronically Signed   By: Larose Hires D.O.   On: 07/28/2021 17:23     Procedures .Marland KitchenLaceration Repair  Date/Time: 07/28/2021 5:34 PM Performed by: Terrilee Files, MD Authorized by: Terrilee Files, MD   Consent:    Consent obtained:  Verbal   Consent given by:  Patient   Risks discussed:  Infection, pain, poor cosmetic result, poor wound healing and retained foreign body   Alternatives discussed:  No treatment and delayed treatment Universal protocol:    Procedure explained and questions answered to patient or proxy's satisfaction: yes     Patient identity confirmed:  Verbally with patient Anesthesia:    Anesthesia method:  Local infiltration   Local anesthetic:  Lidocaine 2% WITH epi Laceration details:    Location:  Scalp   Scalp location:  L parietal   Length (cm):  3 Treatment:    Area cleansed with:  Saline   Amount of cleaning:  Standard   Irrigation solution:  Sterile saline   Debridement:  None Skin repair:    Repair method:  Staples   Number of staples:  4 Approximation:    Approximation:  Close Repair type:    Repair type:  Simple Post-procedure details:    Dressing:  Open (no dressing)   Procedure completion:  Tolerated well, no immediate complications   Medications Ordered in ED Medications  lidocaine-EPINEPHrine (XYLOCAINE W/EPI) 2 %-1:200000 (PF) injection 10 mL (has no administration in time range)  Tdap (BOOSTRIX) injection 0.5 mL (has no administration in time range)    ED Course  I have reviewed the triage vital signs and the nursing notes.  Pertinent labs & imaging results that were available during my care of the patient were reviewed by me and considered in my medical decision making (see chart for details).  Clinical Course as of 07/28/21 1734  Sun Jul 28, 2021  1721 CT ordered and interpreted by me as no acute intracranial bleed.  Awaiting  radiology reading. [MB]    Clinical Course User Index [MB] Terrilee Files, MD   MDM Rules/Calculators/A&P                          This patient complains  of mechanical fall with scalp laceration.  Also has some pain in her left shoulder and low back.; this involves an extensive number of treatment Options and is a complaint that carries with it a high risk of complications and Morbidity. The differential includes skull fracture, bleed, laceration, head injury, fracture and dislocation  I ordered medication tetanus update I ordered imaging studies which included CT head and I independently    visualized and interpreted imaging which showed no fracture or intracranial bleed Additional history obtained from patient's husband Previous records obtained and reviewed in epic no recent admissions  After the interventions stated above, I reevaluated the patient and found patient to be fairly asymptomatic.  She is ambulated in the department without any difficulty.  She declines any further x-ray imaging of her shoulder and back.  Return instructions discussed   Final Clinical Impression(s) / ED Diagnoses Final diagnoses:  Fall, initial encounter  Laceration of scalp, initial encounter    Rx / DC Orders ED Discharge Orders     None        Terrilee Files, MD 07/29/21 [phone]
